# Patient Record
Sex: Female | Born: 1945 | Race: Black or African American | Hispanic: No | Marital: Married | Smoking: Never smoker
Health system: Southern US, Community
[De-identification: ages and names within clinical notes are randomized; demographics above are authoritative.]

## PROBLEM LIST (undated history)

## (undated) DIAGNOSIS — I10 Essential (primary) hypertension: Secondary | ICD-10-CM

## (undated) HISTORY — PX: BRAIN SURGERY: SHX531

## (undated) HISTORY — PX: ABDOMINAL HYSTERECTOMY: SHX81

---

## 1984-12-02 HISTORY — PX: BUNIONECTOMY: SHX129

## 2002-10-01 ENCOUNTER — Ambulatory Visit (HOSPITAL_COMMUNITY): Admission: RE | Admit: 2002-10-01 | Discharge: 2002-10-01 | Payer: Self-pay | Admitting: Internal Medicine

## 2005-06-22 ENCOUNTER — Emergency Department (HOSPITAL_COMMUNITY): Admission: EM | Admit: 2005-06-22 | Discharge: 2005-06-22 | Payer: Self-pay | Admitting: Emergency Medicine

## 2005-11-19 IMAGING — CR DG FOOT COMPLETE 3+V*L*
3 series · 3 of 3 positions shown · non-contrast
Comparison: none

CLINICAL DATA: Medial mid left foot pain following injury.

LEFT FOOT - 3 VIEW

[view not recorded (1 of 3)]
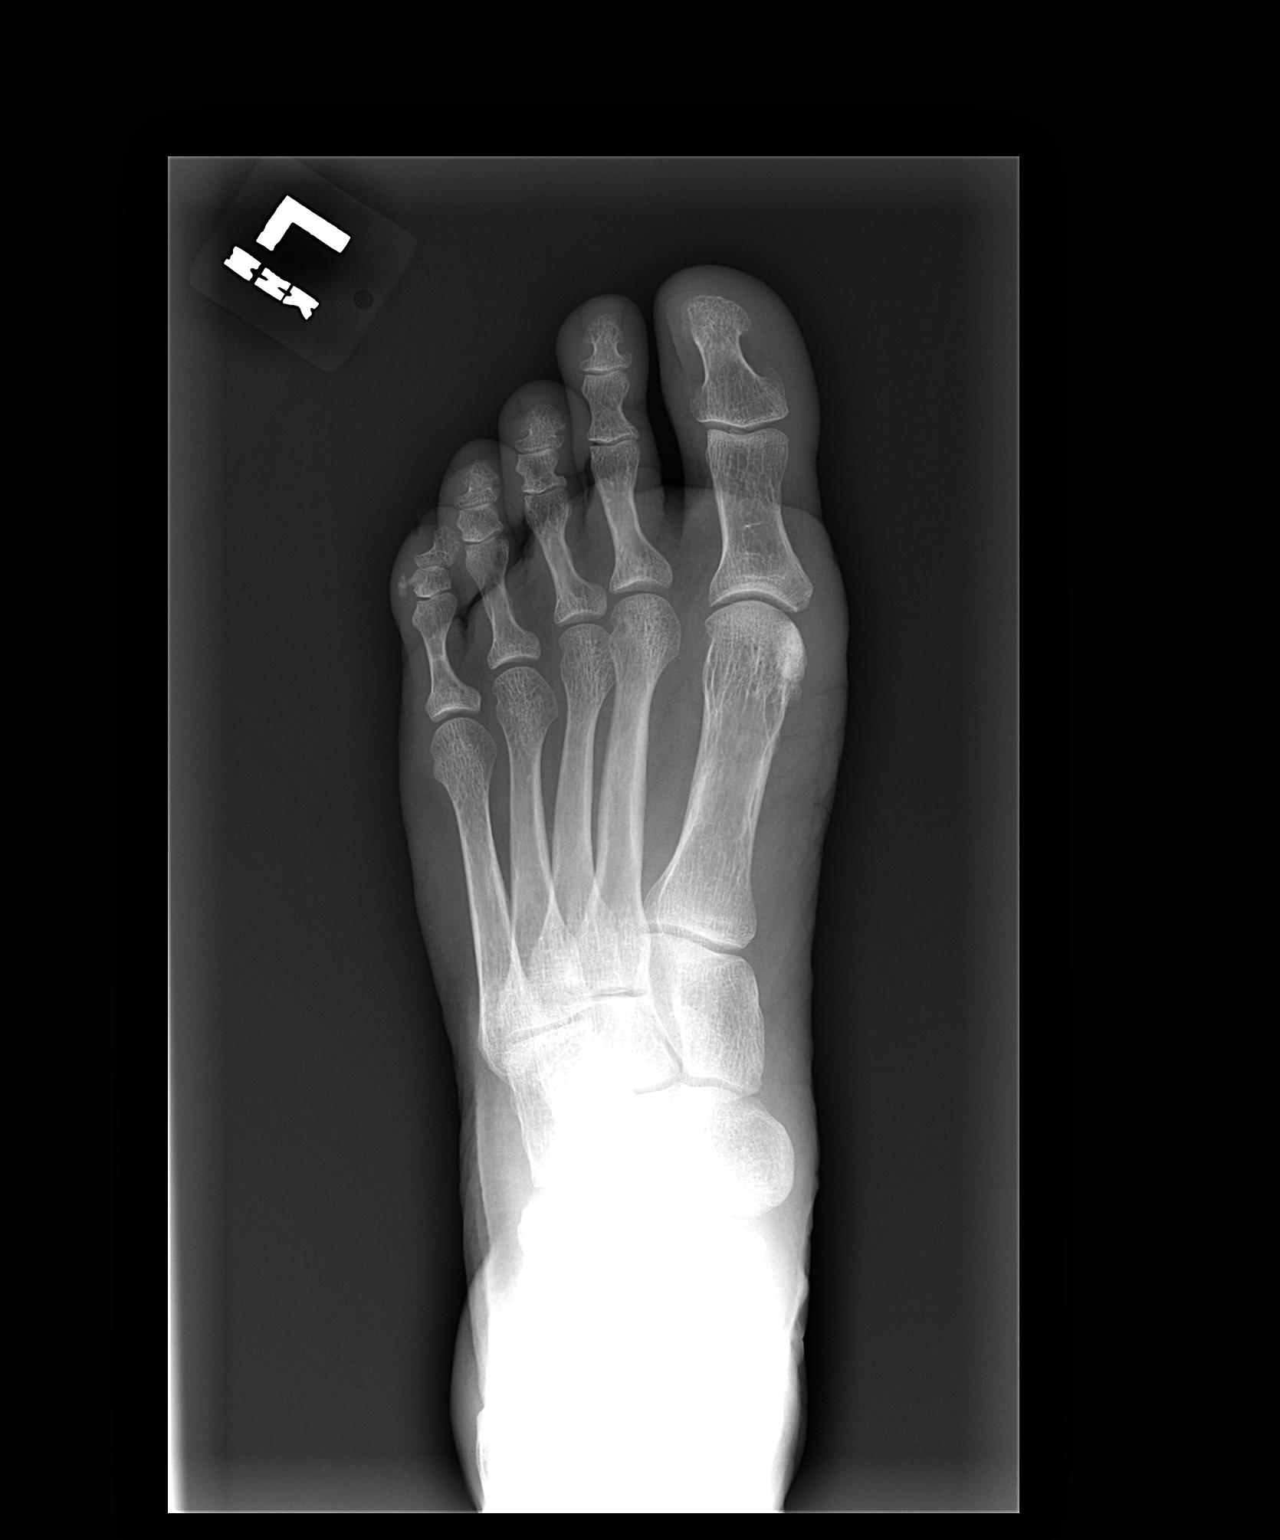

[view not recorded (2 of 3)]
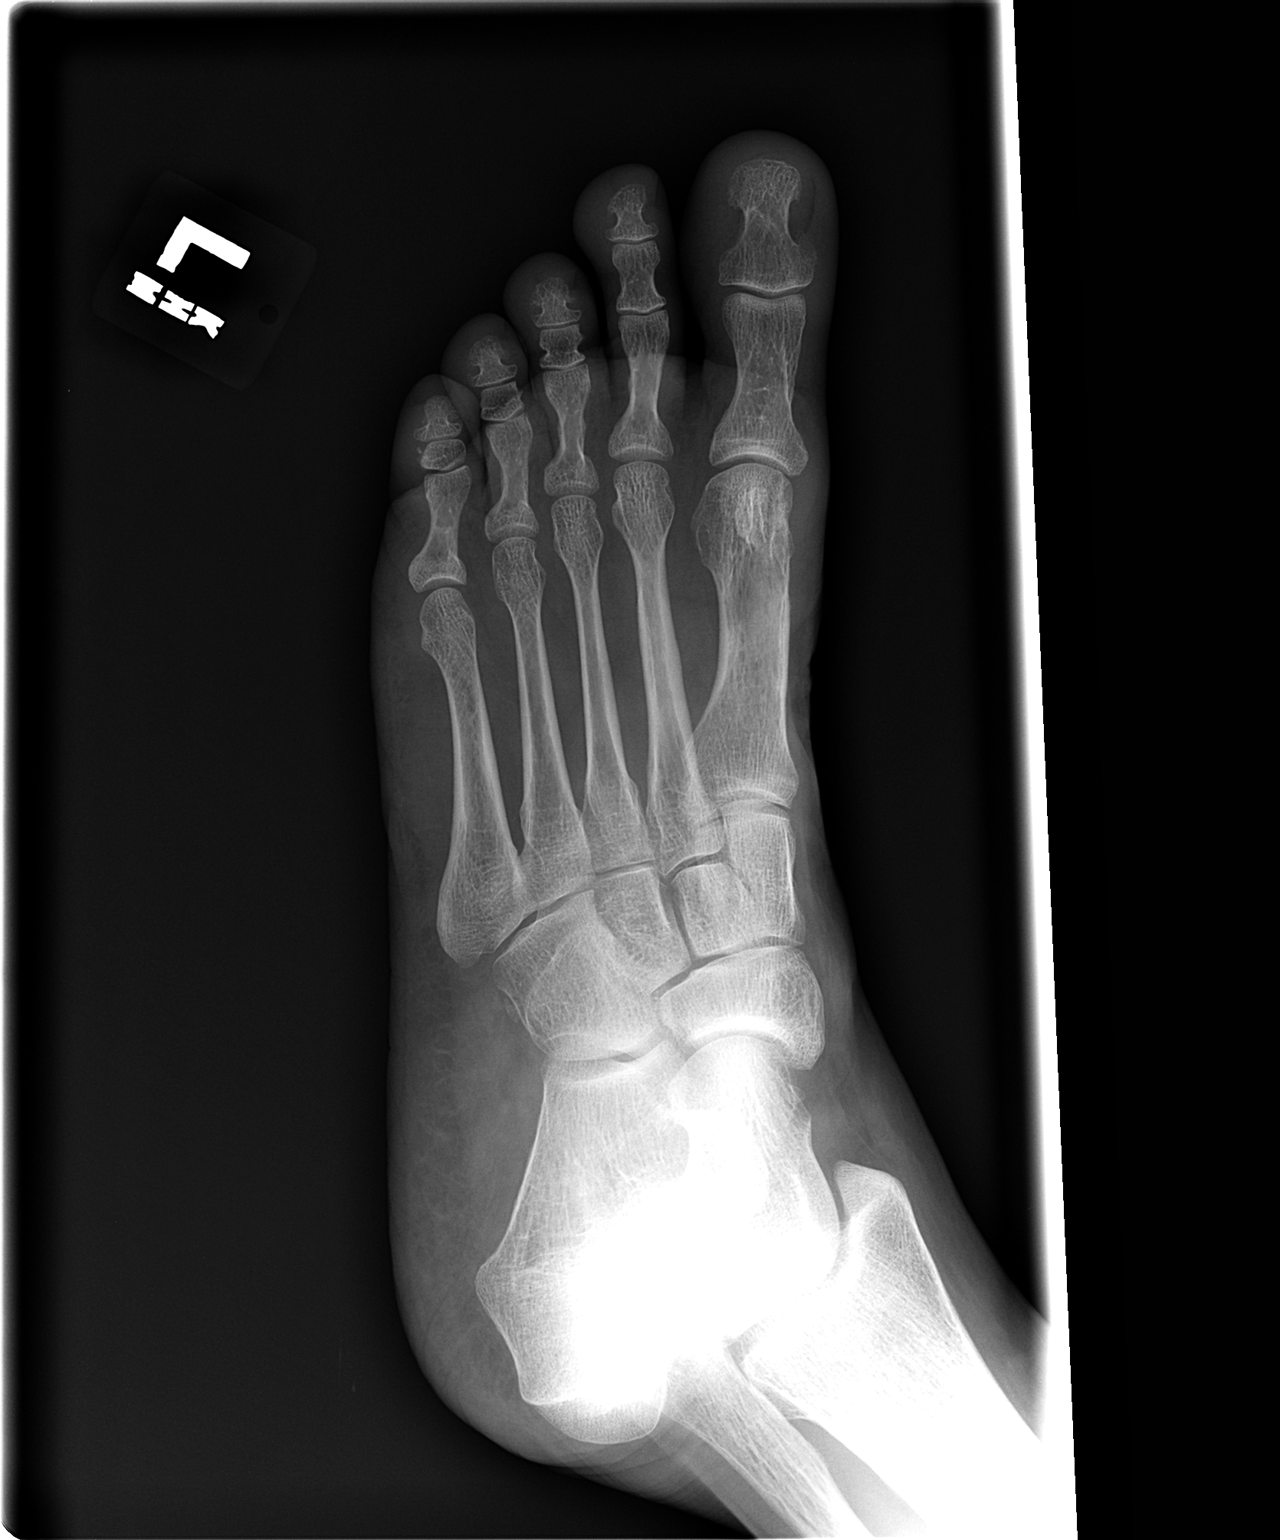

[view not recorded (3 of 3)]
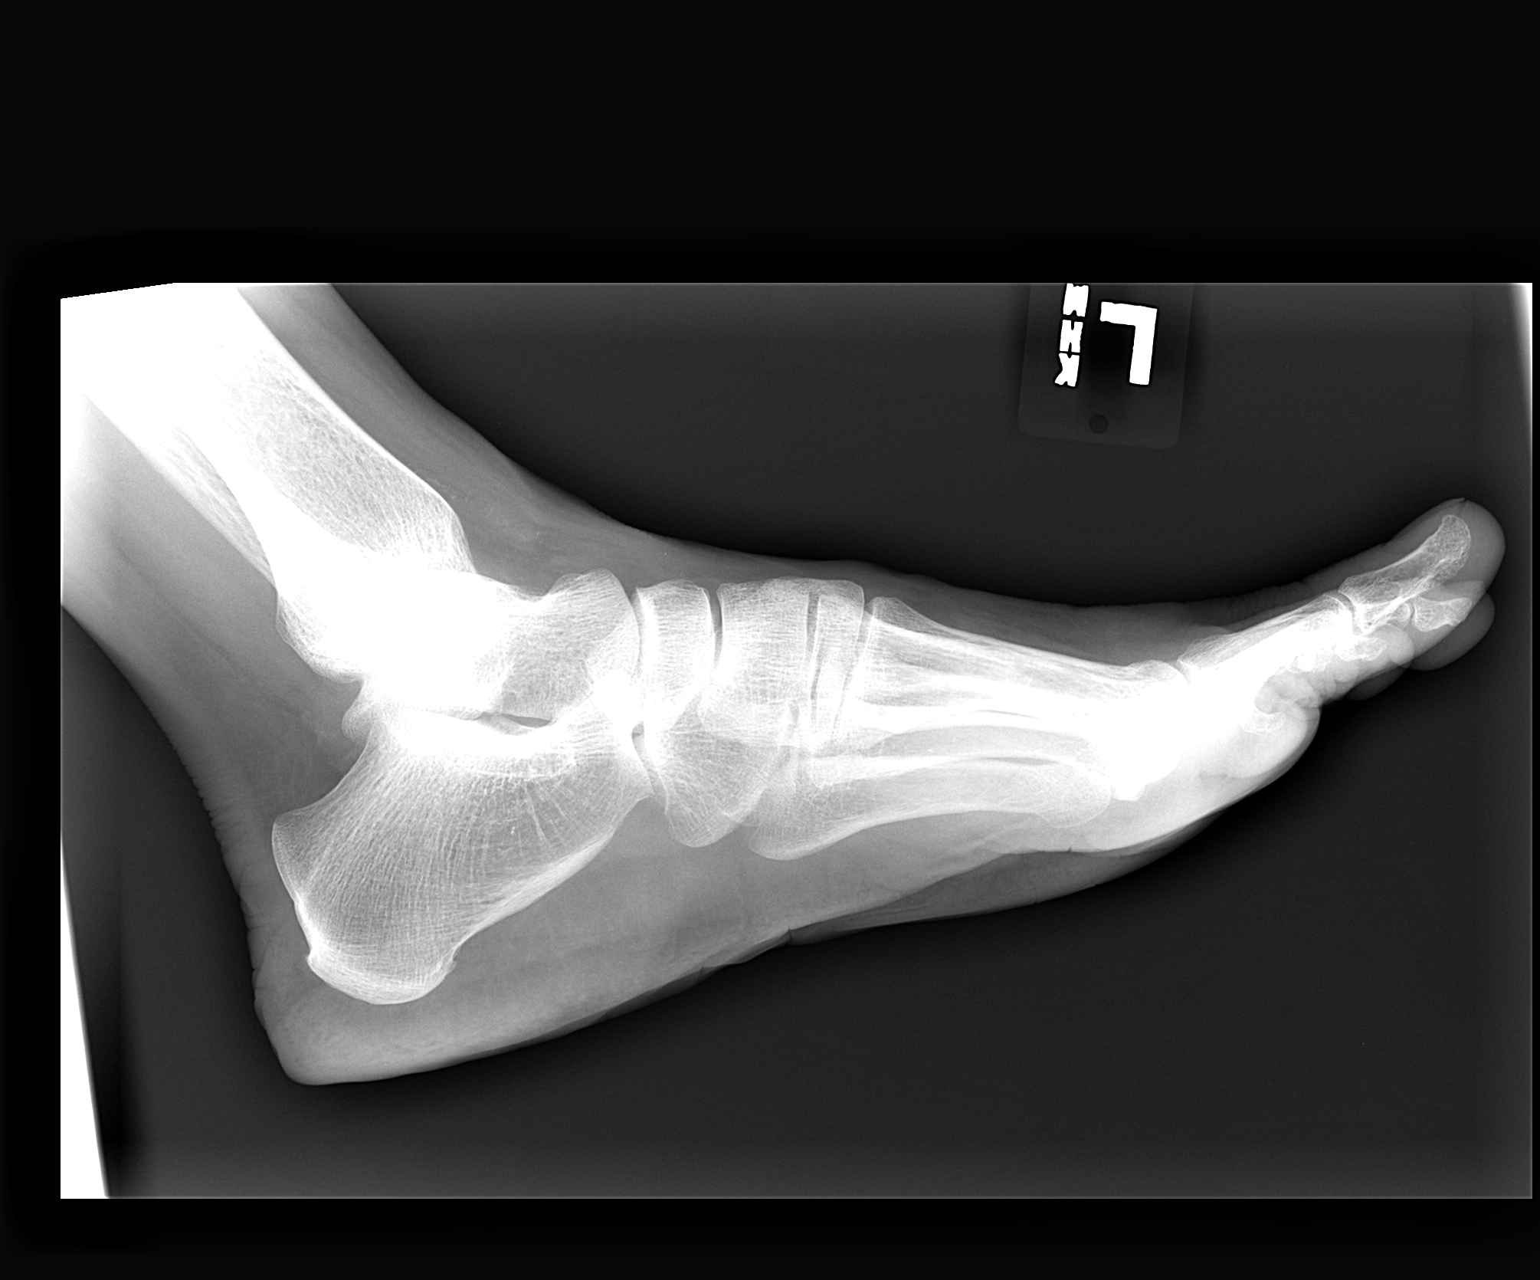

[3 of 3 positions shown; findings below may reference images not displayed]

FINDINGS: Soft tissue calcification adjacent to the lateral aspect of the fifth
middle phalanx. No fracture or dislocation seen.

IMPRESSION

No fracture.

## 2005-12-09 ENCOUNTER — Ambulatory Visit: Payer: Self-pay | Admitting: Internal Medicine

## 2005-12-11 ENCOUNTER — Ambulatory Visit (HOSPITAL_COMMUNITY): Admission: RE | Admit: 2005-12-11 | Discharge: 2005-12-11 | Payer: Self-pay | Admitting: Internal Medicine

## 2006-01-17 ENCOUNTER — Ambulatory Visit: Payer: Self-pay | Admitting: Internal Medicine

## 2006-02-14 ENCOUNTER — Ambulatory Visit (HOSPITAL_COMMUNITY): Admission: RE | Admit: 2006-02-14 | Discharge: 2006-02-14 | Payer: Self-pay | Admitting: Internal Medicine

## 2006-02-14 ENCOUNTER — Ambulatory Visit: Payer: Self-pay | Admitting: Internal Medicine

## 2006-05-10 IMAGING — CT CT PELVIS W/ CM
1 of 2 series · 14 of 32 positions shown, 19 images · IV contrast (omnipaque)
Comparison: None.

CLINICAL DATA: Right lower quadrant pain ? previous hysterectomy and appendectomy.
 ABDOMEN CT WITH CONTRAST:
TECHNIQUE: Multidetector CT imaging of the abdomen was performed following the standard protocol during bolus administration of intravenous contrast.
 Contrast:  100 cc Omnipaque 300 and oral contrast.
TECHNIQUE: Multidetector CT imaging of the pelvis was performed following the standard protocol during bolus administration of intravenous contrast.

[Series 224: — · axial · 0.70mm/px · z∈[+1295,+1675]mm · 14 of 84 slices shown, 19 images]
[im 4/84  soft-tissue]
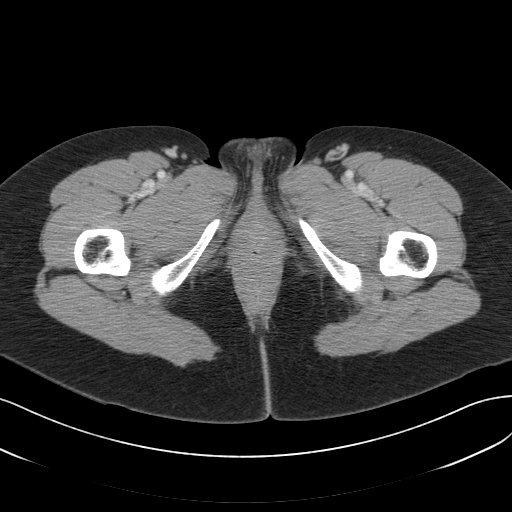
[im 4/84  bone]
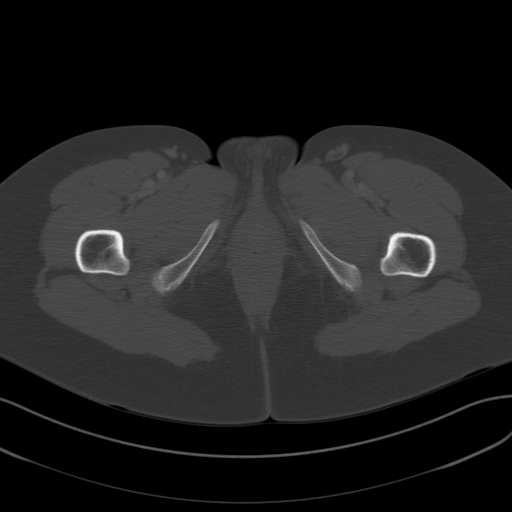
[im 12/84  soft-tissue]
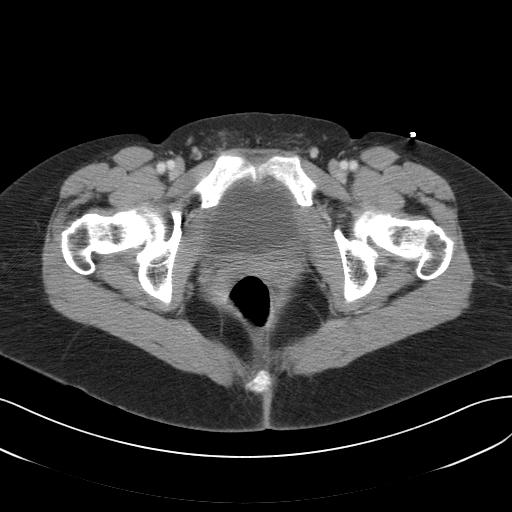
[im 16/84  soft-tissue]
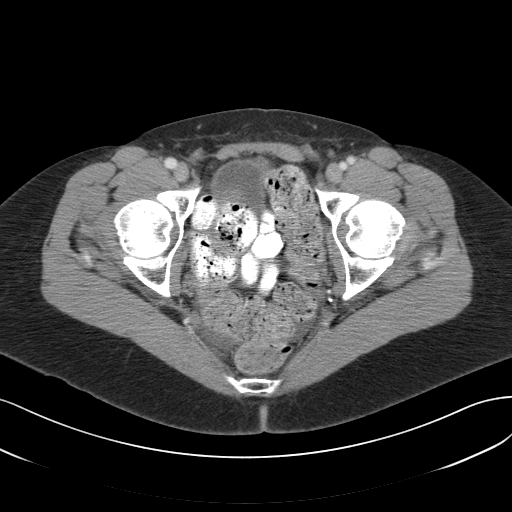
[im 24/84  soft-tissue]
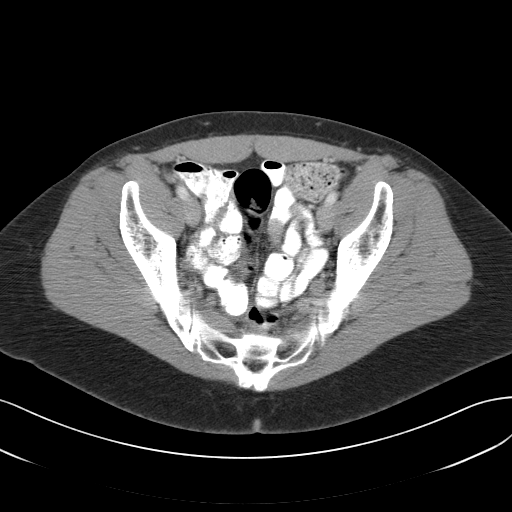
[im 28/84  soft-tissue]
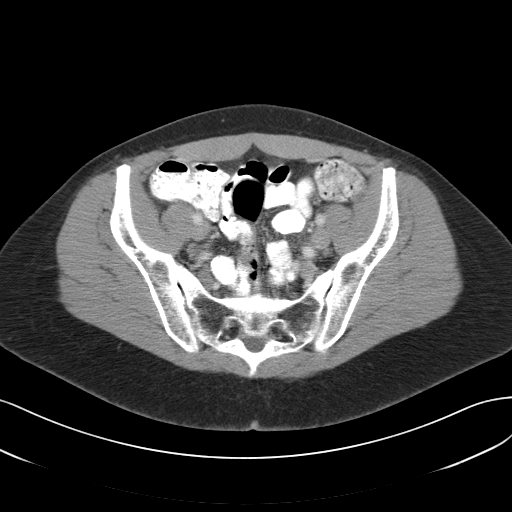
[im 36/84  soft-tissue]
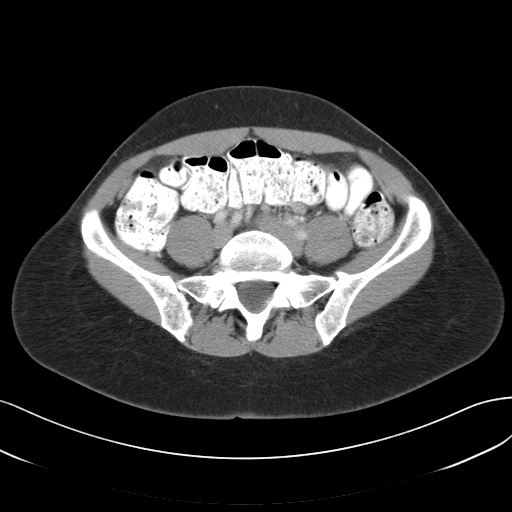
[im 44/84  soft-tissue]
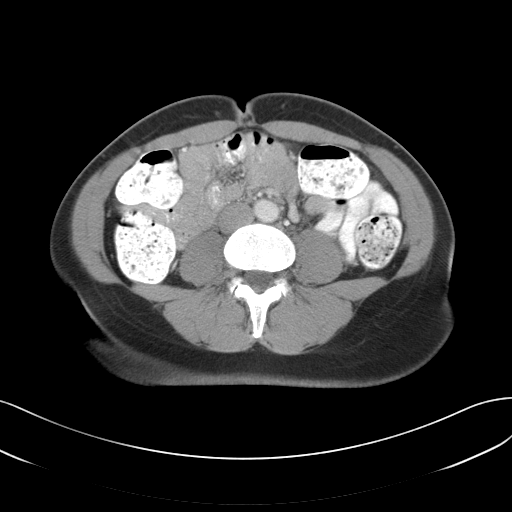
[im 48/84  soft-tissue]
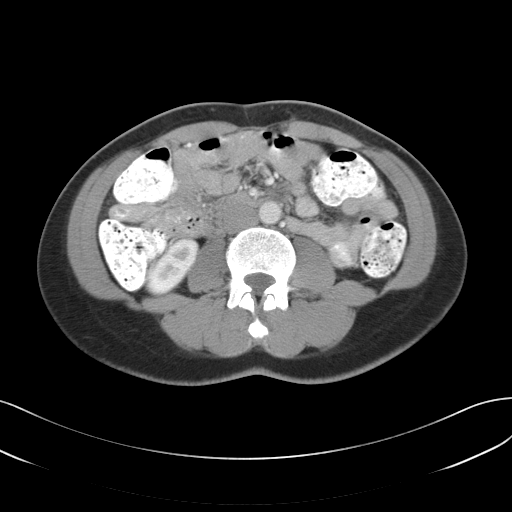
[im 56/84  soft-tissue]
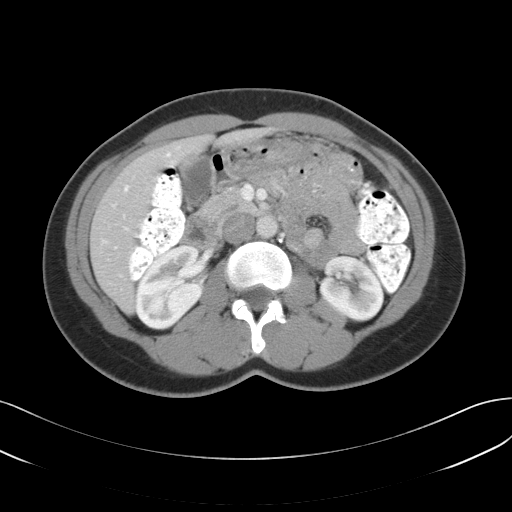
[im 56/84  bone]
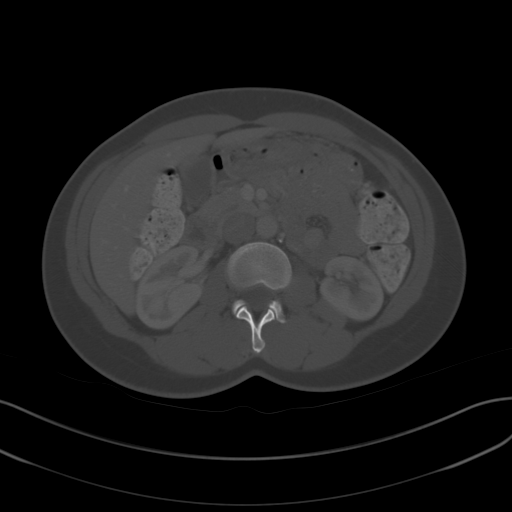
[im 60/84  soft-tissue]
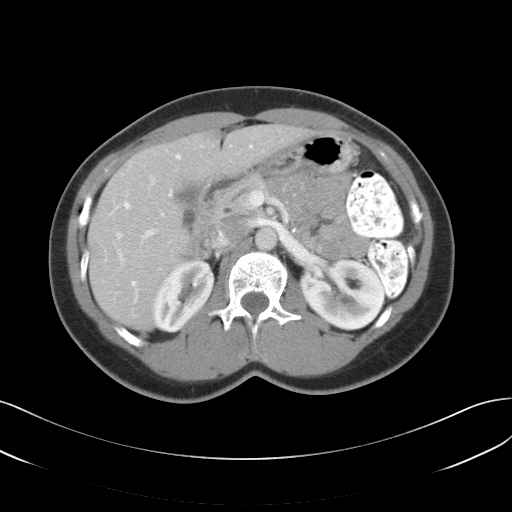
[im 68/84  soft-tissue]
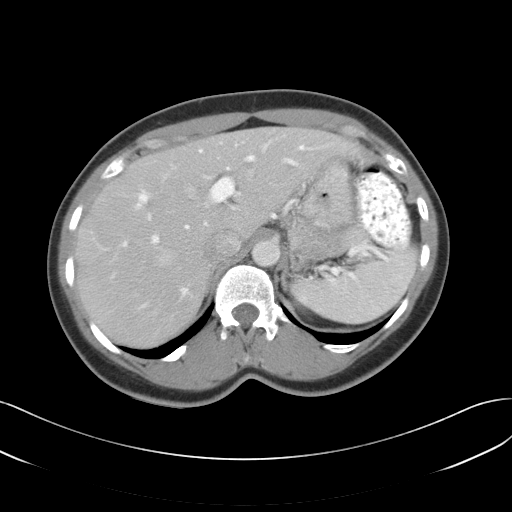
[im 68/84  lung]
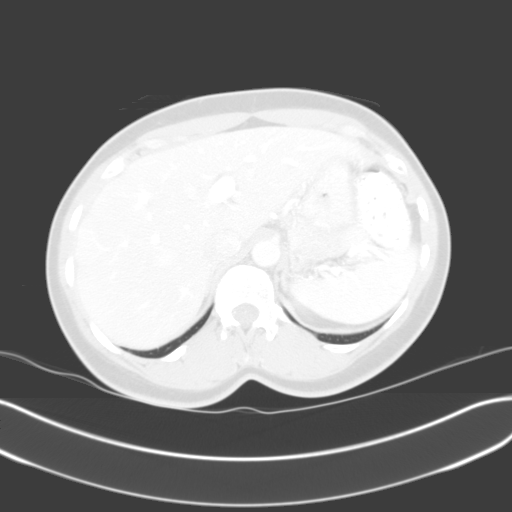
[im 72/84  soft-tissue]
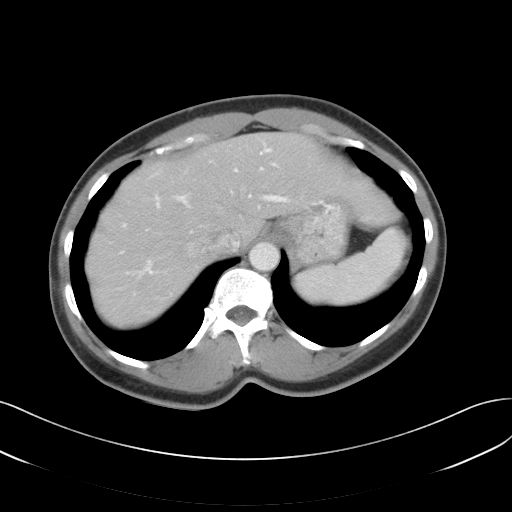
[im 72/84  lung]
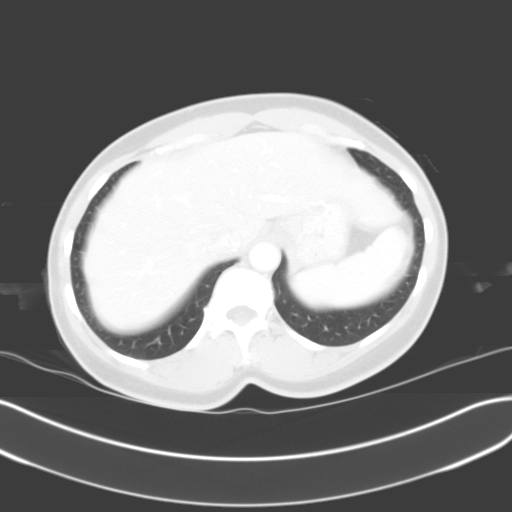
[im 76/84  lung]
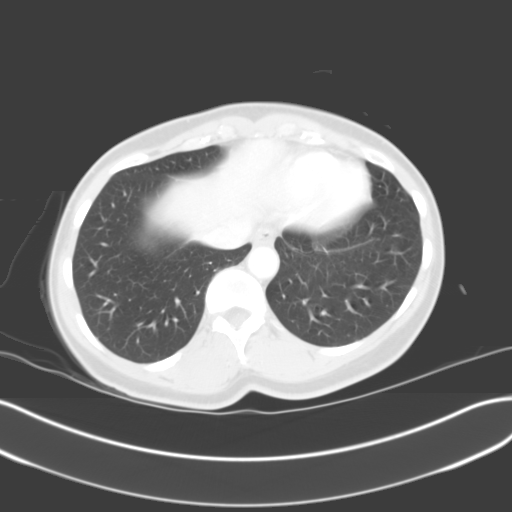
[im 80/84  soft-tissue]
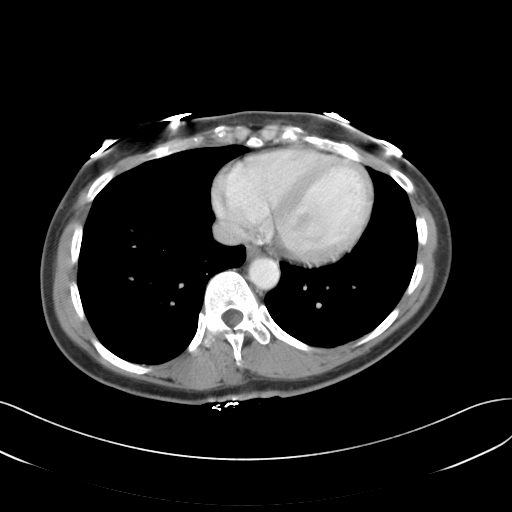
[im 80/84  lung]
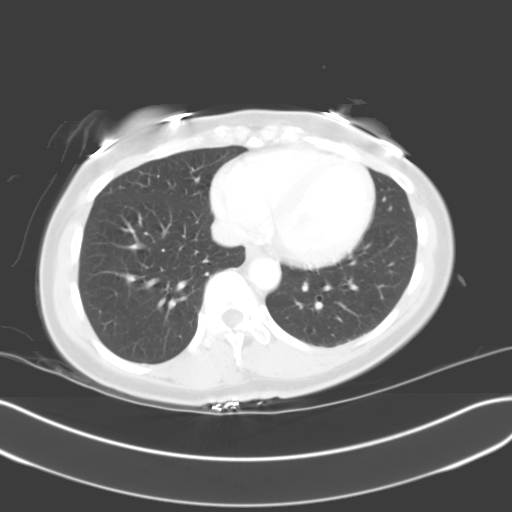

[14 of 32 positions shown; findings below may reference images not displayed]

FINDINGS: Lung bases are clear.  No significant lesions of the liver. There is a tiny lucency in the periphery of the right lobe seen on image #27 that is likely a tiny cyst.  No intra or extrahepatic bile duct dilatation. The spleen, pancreas, and adrenals normal.  Early and delayed images of the kidneys unremarkable. No adenopathy or ascites. No calcified gallstones or bile duct dilatation.
IMPRESSION: No acute or significant findings ? there is probably a tiny right hepatic cyst.
 PELVIS CT WITH CONTRAST:
FINDINGS: No focal masses, adenopathy, or fluid collections. Prior hysterectomy and appendectomy. Pelvic sidewalls and presacral space normal. Relative increased stool throughout the colon.
IMPRESSION: No acute or significant findings ? there is increased stool throughout the colon.

## 2007-03-12 ENCOUNTER — Ambulatory Visit: Payer: Self-pay | Admitting: Internal Medicine

## 2011-04-19 NOTE — Op Note (Signed)
NAME:  Priscilla Brown, Priscilla Brown                        ACCOUNT NO.:  [id number]   MEDICAL RECORD NO.:  [id number]                   PATIENT TYPE:  AMB   LOCATION:  DAY                                  FACILITY:  [facility]   PHYSICIAN:  Lionel December, M.D.                 DATE OF BIRTH:  23-Feb-1946   DATE OF PROCEDURE:  10/01/2002  DATE OF DISCHARGE:                                 OPERATIVE REPORT   PROCEDURE:  Total colonoscopy.   ENDOSCOPIST:  Lionel December, M.D.   INDICATIONS:  This patient is a 65 year old African-American female was  undergoing screening colonoscopy.  The procedure was reviewed with the  patient and informed consent was obtained.   PREOPERATIVE MEDICATIONS:  Demerol 50 mg IV and Versed 5 mg IV in divided  doses.   INSTRUMENT:  Olympus video system.   FINDINGS:  Procedure performed in endoscopy suite.  The patient's vital  signs and O2 saturation were monitored during the procedure and remained  stable.  The patient was placed in the left lateral recumbent position and  rectal examination was performed.  No abnormality noted on external or  digital exam.   The scope was placed in the rectum and advanced under vision into the  sigmoid colon and beyond.  Preparation was excellent.  Scope was passed to  the cecum which was identified by appendiceal stump and the ileocecal valve.  Pictures were taken for the record.  A few small erosions overlying the  ileocecal valve and another one crossing the valve.  Pictures taken for the  record.   I attempted to pass the scope into the TR but could not do so because I  could never see the opening.  Biopsy was taken from the ileocecal valve and  from the cecal erosion.  As the scope was withdrawn the colonic mucosa was  once again carefully examined.  There was a single small AVM at the hepatic  flexure which was not bleeding and was left alone.  Because the rest of the  colon was normal, the rectal mucosa similarly was  normal.   The scope was retroflexed to examine the anorectal junction and hemorrhoids  were noted below the dentate line.  The endoscope was straightened and  withdrawn.  The patient tolerated the procedure well.   FINAL DIAGNOSES:  1. Examination performed to the cecum.  2. A few erosions at the cecum, possibly an insignificant finding.  Biopsy     taken to make sure that this is not granulomatous disease.  3. 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  4. No evidence of colonic neoplasm.  5. Small external hemorrhoids.    RECOMMENDATIONS:  1. Standard instructions given.  2. I will be contacting the patient with biopsy results and further     recommendations.  Lionel December, M.D.    NR/MEDQ  D:  10/01/2002  T:  10/01/2002  Job:  [id number]   cc:   Weyman Pedro, M.D.  [place]  [place]

## 2011-04-19 NOTE — Op Note (Signed)
NAMEBRADEN, Priscilla Brown              ACCOUNT NO.:  [id number]   MEDICAL RECORD NO.:  [id number]          PATIENT TYPE:  AMB   LOCATION:  DAY                           FACILITY:  [facility]   PHYSICIAN:  Lionel December, M.D.    DATE OF BIRTH:  1946/03/05   DATE OF PROCEDURE:  02/14/2006  DATE OF DISCHARGE:                                 OPERATIVE REPORT   PROCEDURE:  Colonoscopy.   INDICATION:  [name] is a 65 year old African-American female who was  recently evaluated for right lower quadrant abdominal pain, possibly due to  constipation, predominant IBS, when she was noted to have heme-positive  stool. We recommended colonoscopy given that she had focal colitis at the  cecum on the last exam of October 2003. Procedure and risks were reviewed  with the patient and informed consent was obtained. Family history is  negative for colorectal carcinoma with positive further malignancies.   MEDICINES FOR CONSCIOUS SEDATION:  Demerol 50 mg IV Versed 7 mg IV in  divided dose.   FINDINGS:  Procedure performed in endoscopy suite. The patient's vital signs  and O2 saturations were monitored during procedure and remained stable. The  patient was placed in the left lateral position and rectal examination  performed. No abnormality noted on external or digital exam. Olympus  videoscope was placed in the rectum and advanced under vision into sigmoid  colon and beyond. Preparation was excellent. Redundant colon, particularly  hepatic flexure, multiple loops. Using abdominal pressure and different  positions, I was able to finally pass the scope into cecum which was  identified by ileocecal valve. Blind cecum was normal.   This area was carefully examined and no erosions or ulcers noted as on  previous exam. Pictures taken for the record. No erosions or ulcers are  noted. As the scope was withdrawn colonic mucosa was once again carefully  examined. A single AVM at hepatic flexure, which was not bleeding,  was left  alone. Mucosa and rest of the colon was normal. Rectal mucosa similarly was  normal. Scope was retroflexed to examine anorectal junction and small  hemorrhoids were noted below the dentate line. Endoscope was straightened  and withdrawn. The patient tolerated the procedure well.   FINAL DIAGNOSIS:  1.  Redundant colon with a single arteriovenous malformation at hepatic      flexure.  2.  External hemorrhoids.  3.  No evidence of colitis or neoplasm.   RECOMMENDATIONS:  1.  She will resume her usual meds.  2.  Continue high-fiber diet, fiber supplement and Peri-Colace as before.  3.  She will return for OV in 1 year from now.      Lionel December, M.D.  Electronically Signed     NR/MEDQ  D:  02/14/2006  T:  02/14/2006  Job:  [id number]

## 2016-03-14 ENCOUNTER — Encounter (INDEPENDENT_AMBULATORY_CARE_PROVIDER_SITE_OTHER): Payer: Self-pay | Admitting: *Deleted

## 2016-03-27 ENCOUNTER — Other Ambulatory Visit (INDEPENDENT_AMBULATORY_CARE_PROVIDER_SITE_OTHER): Payer: Self-pay | Admitting: *Deleted

## 2016-03-27 DIAGNOSIS — Z1211 Encounter for screening for malignant neoplasm of colon: Secondary | ICD-10-CM

## 2016-06-03 ENCOUNTER — Telehealth (INDEPENDENT_AMBULATORY_CARE_PROVIDER_SITE_OTHER): Payer: Self-pay | Admitting: *Deleted

## 2016-06-03 ENCOUNTER — Other Ambulatory Visit (INDEPENDENT_AMBULATORY_CARE_PROVIDER_SITE_OTHER): Payer: Self-pay | Admitting: *Deleted

## 2016-06-03 ENCOUNTER — Encounter (INDEPENDENT_AMBULATORY_CARE_PROVIDER_SITE_OTHER): Payer: Self-pay | Admitting: *Deleted

## 2016-06-03 NOTE — Telephone Encounter (Signed)
Referring MD/PCP: daniel   Procedure: tcs  Reason/Indication:  screening  Has patient had this procedure before?  Yes, 10 yrs ago  If so, when, by whom and where?    Is there a family history of colon cancer?  no  Who?  What age when diagnosed?    Is patient diabetic?   no      Does patient have prosthetic heart valve or mechanical valve?  no  Do you have a pacemaker?  no  Has patient ever had endocarditis? no  Has patient had joint replacement within last 12 months?  no  Does patient tend to be constipated or take laxatives? some  Does patient have a history of alcohol/drug use?  no  Is patient on Coumadin, Plavix and/or Aspirin? no  Medications: diltiazem 180 mg daily, fish oil, glucosamine  Allergies: sulfur drugs, pcn, codeine  Medication Adjustment:   Procedure date & time: 07/04/16 at 730

## 2016-06-03 NOTE — Telephone Encounter (Signed)
Patient needs trilyte 

## 2016-06-05 NOTE — Telephone Encounter (Signed)
agree

## 2016-06-06 MED ORDER — PEG 3350-KCL-NA BICARB-NACL 420 G PO SOLR: 4000.0000 mL | Freq: Once | ORAL | Status: DC

## 2016-07-04 ENCOUNTER — Encounter (HOSPITAL_COMMUNITY)
Admission: RE | Disposition: A | Discharge status: Home / Self Care | Payer: Self-pay | Source: Ambulatory Visit | Attending: Internal Medicine

## 2016-07-04 ENCOUNTER — Encounter (HOSPITAL_COMMUNITY): Payer: Self-pay

## 2016-07-04 ENCOUNTER — Ambulatory Visit (HOSPITAL_COMMUNITY)
Admission: RE | Admit: 2016-07-04 | Discharge: 2016-07-04 | Disposition: A | Discharge status: Home / Self Care | Payer: Managed Care, Other (non HMO) | Source: Ambulatory Visit | Attending: Internal Medicine | Admitting: Internal Medicine

## 2016-07-04 DIAGNOSIS — I10 Essential (primary) hypertension: Secondary | ICD-10-CM | POA: Diagnosis not present

## 2016-07-04 DIAGNOSIS — Z880 Allergy status to penicillin: Secondary | ICD-10-CM | POA: Diagnosis not present

## 2016-07-04 DIAGNOSIS — K644 Residual hemorrhoidal skin tags: Secondary | ICD-10-CM

## 2016-07-04 DIAGNOSIS — K6389 Other specified diseases of intestine: Secondary | ICD-10-CM | POA: Diagnosis not present

## 2016-07-04 DIAGNOSIS — Z885 Allergy status to narcotic agent status: Secondary | ICD-10-CM | POA: Diagnosis not present

## 2016-07-04 DIAGNOSIS — K529 Noninfective gastroenteritis and colitis, unspecified: Secondary | ICD-10-CM | POA: Insufficient documentation

## 2016-07-04 DIAGNOSIS — K633 Ulcer of intestine: Secondary | ICD-10-CM | POA: Diagnosis not present

## 2016-07-04 DIAGNOSIS — Z882 Allergy status to sulfonamides status: Secondary | ICD-10-CM | POA: Diagnosis not present

## 2016-07-04 DIAGNOSIS — Z1211 Encounter for screening for malignant neoplasm of colon: Secondary | ICD-10-CM | POA: Diagnosis not present

## 2016-07-04 DIAGNOSIS — Z79899 Other long term (current) drug therapy: Secondary | ICD-10-CM | POA: Insufficient documentation

## 2016-07-04 HISTORY — DX: Essential (primary) hypertension: I10

## 2016-07-04 HISTORY — PX: COLONOSCOPY: SHX5424

## 2016-07-04 SURGERY — COLONOSCOPY
Anesthesia: Moderate Sedation

## 2016-07-04 MED ORDER — MIDAZOLAM HCL 5 MG/5ML IJ SOLN
INTRAMUSCULAR | Status: DC | PRN
  Administered 2016-07-04 (×2): 2 mg via INTRAVENOUS
  Administered 2016-07-04: 1 mg via INTRAVENOUS

## 2016-07-04 MED ORDER — MEPERIDINE HCL 50 MG/ML IJ SOLN
INTRAMUSCULAR | Status: AC
  Filled 2016-07-04: qty 1

## 2016-07-04 MED ORDER — MIDAZOLAM HCL 5 MG/5ML IJ SOLN
INTRAMUSCULAR | Status: AC
  Filled 2016-07-04: qty 10

## 2016-07-04 MED ORDER — STERILE WATER FOR IRRIGATION IR SOLN
Status: DC | PRN
  Administered 2016-07-04: 08:00:00

## 2016-07-04 MED ORDER — MEPERIDINE HCL 50 MG/ML IJ SOLN
INTRAMUSCULAR | Status: DC | PRN
  Administered 2016-07-04 (×2): 25 mg via INTRAVENOUS

## 2016-07-04 MED ORDER — SODIUM CHLORIDE 0.9 % IV SOLN
INTRAVENOUS | Status: DC
Start: 2016-07-04 — End: 2016-07-04
  Administered 2016-07-04: 07:00:00 via INTRAVENOUS

## 2016-07-04 NOTE — H&P (Signed)
Priscilla Brown is an 70 y.o. female.   Chief Complaint: Patient is here for colonoscopy. HPI: 70 year old African-American female who is here for screening colonoscopy. She denies abdominal pain or rectal bleeding has had constipation lately. She has decreased intake of fiber foods. Last colonoscopy was in October 2003. It was negative for colonic polyps. Family history is negative for CRC.  Past Medical History:  Diagnosis Date  . Hypertension     Past Surgical History:  Procedure Laterality Date  . ABDOMINAL HYSTERECTOMY    . BRAIN SURGERY  1996 St[place]  . BUNIONECTOMY Bilateral 1986    History reviewed. No pertinent family history. Social History:  reports that she has never smoked. She has never used smokeless tobacco. She reports that she does not drink alcohol or use drugs.  Allergies:  Allergies  Allergen Reactions  . Codeine     Jittery   . Erythromycin     Unknown   . Penicillins     unknown  . Sulfa Antibiotics Rash    Rapid heartbeat     Medications Prior to Admission  Medication Sig Dispense Refill  . diltiazem (DILACOR XR) 180 MG 24 hr capsule Take 180 mg by mouth daily.    Marland Kitchen loratadine (CLARITIN) 10 MG tablet Take 10 mg by mouth daily as needed for allergies.    . Multiple Vitamins-Minerals (MULTIVITAMINS THER. W/MINERALS) TABS tablet Take 1 tablet by mouth daily.    Ailene Ards 3-6-9 Fatty Acids (OMEGA-3-6-9 PO) Take 1 capsule by mouth daily.    . polyethylene glycol-electrolytes (TRILYTE) 420 g solution Take 4,000 mLs by mouth once. 4000 mL 0    No results found for this or any previous visit (from the past 48 hour(s)). No results found.  ROS  Blood pressure (!) 152/68, pulse 65, temperature 98.2 F (36.8 C), temperature source Oral, resp. rate 14, height 5\' 4"  (1.626 m), weight 144 lb (65.3 kg), SpO2 100 %. Physical Exam  Constitutional: She appears well-developed and well-nourished.  HENT:  Mouth/Throat: Oropharynx is clear and moist.   Eyes: Conjunctivae are normal. No scleral icterus.  Neck: No thyromegaly present.  Cardiovascular: Normal rate, regular rhythm and normal heart sounds.   No murmur heard. Respiratory: Effort normal and breath sounds normal.  GI: Soft. She exhibits no distension and no mass. There is no tenderness.  Musculoskeletal: She exhibits no edema.  Lymphadenopathy:    She has no cervical adenopathy.  Neurological: She is alert.  Skin: Skin is warm and dry.     Assessment/Plan Average risk screening colonoscopy.  Lionel December, MD 07/04/2016, 7:34 AM

## 2016-07-04 NOTE — Op Note (Signed)
[facility] Patient Name: Priscilla Brown Procedure Date: 07/04/2016 7:32 AM MRN: [id number] Date of Birth: August 09, 1946 Attending MD: Lionel December , MD CSN: [id number] Age: 70 Admit Type: Outpatient Procedure:                Colonoscopy Indications:              Screening for colorectal malignant neoplasm Providers:                Lionel December, MD, Edrick Kins, RN, Lollie Marrow.                            Val Eagle, Technician Referring MD:             Donzetta Sprung, MD Medicines:                Meperidine 50 mg IV, Midazolam 5 mg IV Complications:            No immediate complications. Estimated Blood Loss:     Estimated blood loss was minimal. Procedure:                Pre-Anesthesia Assessment:                           - Prior to the procedure, a History and Physical                            was performed, and patient medications and                            allergies were reviewed. The patient's tolerance of                            previous anesthesia was also reviewed. The risks                            and benefits of the procedure and the sedation                            options and risks were discussed with the patient.                            All questions were answered, and informed consent                            was obtained. Prior Anticoagulants: The patient has                            taken no previous anticoagulant or antiplatelet                            agents. ASA Grade Assessment: I - A normal, healthy                            patient. After reviewing the risks and benefits,  the patient was deemed in satisfactory condition to                            undergo the procedure.                           After obtaining informed consent, the colonoscope                            was passed under direct vision. Throughout the                            procedure, the patient's blood pressure, pulse, and                             oxygen saturations were monitored continuously. The                            EC-[id number] ([id number]) scope was introduced through                            the anus and advanced to the the terminal ileum,                            with identification of the appendiceal orifice and                            IC valve. The colonoscopy was performed without                            difficulty. The patient tolerated the procedure                            well. The quality of the bowel preparation was                            excellent. The terminal ileum, ileocecal valve,                            appendiceal orifice, and rectum were photographed. Scope In: 7:43:23 AM Scope Out: 8:05:09 AM Scope Withdrawal Time: 0 hours 10 minutes 19 seconds  Total Procedure Duration: 0 hours 21 minutes 46 seconds  Findings:      The terminal ileum appeared normal.      A few non-bleeding erosions were found in the cecum and at the ileocecal       valve. No stigmata of recent bleeding were seen. Biopsies were taken       with a cold forceps for histology.      The exam was otherwise without abnormality.      External hemorrhoids were found during retroflexion. The hemorrhoids       were small. Impression:               - The examined portion of the ileum was normal.                           -  A few erosions in the cecum and at the ileocecal                            valve. Biopsied.                           - The examination was otherwise normal.                           - External hemorrhoids.                           comment: patient had cecal erosions on her last                            exam of Oct, 2003. Doubt IBD> Moderate Sedation:      Moderate (conscious) sedation was administered by the endoscopy nurse       and supervised by the endoscopist. The following parameters were       monitored: oxygen saturation, heart rate, blood pressure, CO2       capnography and response  to care. Total physician intraservice time was       28 minutes. Recommendation:           - Patient has a contact number available for                            emergencies. The signs and symptoms of potential                            delayed complications were discussed with the                            patient. Return to normal activities tomorrow.                            Written discharge instructions were provided to the                            patient.                           - High fiber diet today.                           - Continue present medications.                           - Repeat colonoscopy in 10 years for screening                            purposes.                           - Await pathology results. Procedure Code(s):        --- Professional ---  46962, Colonoscopy, flexible; with biopsy, single                            or multiple                           99152, Moderate sedation services provided by the                            same physician or other qualified health care                            professional performing the diagnostic or                            therapeutic service that the sedation supports,                            requiring the presence of an independent trained                            observer to assist in the monitoring of the                            patient's level of consciousness and physiological                            status; initial 15 minutes of intraservice time,                            patient age 5 years or older                           [phone], Moderate sedation services; each additional                            15 minutes intraservice time Diagnosis Code(s):        --- Professional ---                           Z12.11, Encounter for screening for malignant                            neoplasm of colon                           K64.4, Residual hemorrhoidal skin  tags                           K63.3, Ulcer of intestine CPT copyright 2016 [employer]. All rights reserved. The codes documented in this report are preliminary and upon coder review may  be revised to meet current compliance requirements. Lionel December, MD Lionel December, MD 07/04/2016 8:28:50 AM This report has been signed electronically. Number of Addenda: 0

## 2016-07-04 NOTE — Discharge Instructions (Signed)
Resume usual medications and high fiber diet. °No driving for 24 hours. °Physician will call with biopsy results. ° ° °Colonoscopy, Care After °These instructions give you information on caring for yourself after your procedure. Your doctor may also give you more specific instructions. Call your doctor if you have any problems or questions after your procedure. °HOME CARE °· Do not drive for 24 hours. °· Do not sign important papers or use machinery for 24 hours. °· You may shower. °· You may go back to your usual activities, but go slower for the first 24 hours. °· Take rest breaks often during the first 24 hours. °· Walk around or use warm packs on your belly (abdomen) if you have belly cramping or gas. °· Drink enough fluids to keep your pee (urine) clear or pale yellow. °· Resume your normal diet. Avoid heavy or fried foods. °· Avoid drinking alcohol for 24 hours or as told by your doctor. °· Only take medicines as told by your doctor. °If a tissue sample (biopsy) was taken during the procedure:  °· Do not take aspirin or blood thinners for 7 days, or as told by your doctor. °· Do not drink alcohol for 7 days, or as told by your doctor. °· Eat soft foods for the first 24 hours. °GET HELP IF: °You still have a small amount of blood in your poop (stool) 2-3 days after the procedure. °GET HELP RIGHT AWAY IF: °· You have more than a small amount of blood in your poop. °· You see clumps of tissue (blood clots) in your poop. °· Your belly is puffy (swollen). °· You feel sick to your stomach (nauseous) or throw up (vomit). °· You have a fever. °· You have belly pain that gets worse and medicine does not help. °MAKE SURE YOU: °· Understand these instructions. °· Will watch your condition. °· Will get help right away if you are not doing well or get worse. °  °This information is not intended to replace advice given to you by your health care provider. Make sure you discuss any questions you have with your health care  provider. °  °Document Released: 12/21/2010 Document Revised: 11/23/2013 Document Reviewed: 07/26/2013 °Elsevier Interactive Patient Education ©2016 [employer] ° ° °High-Fiber Diet °Fiber, also called dietary fiber, is a type of carbohydrate found in fruits, vegetables, whole grains, and beans. A high-fiber diet can have many health benefits. Your health care provider may recommend a high-fiber diet to help: °· Prevent constipation. Fiber can make your bowel movements more regular. °· Lower your cholesterol. °· Relieve hemorrhoids, uncomplicated diverticulosis, or irritable bowel syndrome. °· Prevent overeating as part of a weight-loss plan. °· Prevent heart disease, type 2 diabetes, and certain cancers. °WHAT IS MY PLAN? °The recommended daily intake of fiber includes: °· 38 grams for men under age 50. °· 30 grams for men over age 50. °· 25 grams for women under age 50. °· 21 grams for women over age 50. °You can get the recommended daily intake of dietary fiber by eating a variety of fruits, vegetables, grains, and beans. Your health care provider may also recommend a fiber supplement if it is not possible to get enough fiber through your diet. °WHAT DO I NEED TO KNOW ABOUT A HIGH-FIBER DIET? °· Fiber supplements have not been widely studied for their effectiveness, so it is better to get fiber through food sources. °· Always check the fiber content on the nutrition facts label of any prepackaged food. Look   for foods that contain at least 5 grams of fiber per serving. °· Ask your dietitian if you have questions about specific foods that are related to your condition, especially if those foods are not listed in the following section. °· Increase your daily fiber consumption gradually. Increasing your intake of dietary fiber too quickly may cause bloating, cramping, or gas. °· Drink plenty of water. Water helps you to digest fiber. °WHAT FOODS CAN I EAT? °Grains °Whole-grain breads. Multigrain cereal. Oats and  oatmeal. Brown rice. Barley. Bulgur wheat. Millet. Bran muffins. Popcorn. Rye wafer crackers. °Vegetables °Sweet potatoes. Spinach. Kale. Artichokes. Cabbage. Broccoli. Green peas. Carrots. Squash. °Fruits °Berries. Pears. Apples. Oranges. Avocados. Prunes and raisins. Dried figs. °Meats and Other Protein Sources °Navy, kidney, pinto, and soy beans. Split peas. Lentils. Nuts and seeds. °Dairy °Fiber-fortified yogurt. °Beverages °Fiber-fortified soy milk. Fiber-fortified orange juice. °Other °Fiber bars. °The items listed above may not be a complete list of recommended foods or beverages. Contact your dietitian for more options. °WHAT FOODS ARE NOT RECOMMENDED? °Grains °White bread. Pasta made with refined flour. White rice. °Vegetables °Fried potatoes. Canned vegetables. Well-cooked vegetables.  °Fruits °Fruit juice. Cooked, strained fruit. °Meats and Other Protein Sources °Fatty cuts of meat. Fried poultry or fried fish. °Dairy °Milk. Yogurt. Cream cheese. Sour cream. °Beverages °Soft drinks. °Other °Cakes and pastries. Butter and oils. °The items listed above may not be a complete list of foods and beverages to avoid. Contact your dietitian for more information. °WHAT ARE SOME TIPS FOR INCLUDING HIGH-FIBER FOODS IN MY DIET? °· Eat a wide variety of high-fiber foods. °· Make sure that half of all grains consumed each day are whole grains. °· Replace breads and cereals made from refined flour or white flour with whole-grain breads and cereals. °· Replace white rice with brown rice, bulgur wheat, or millet. °· Start the day with a breakfast that is high in fiber, such as a cereal that contains at least 5 grams of fiber per serving. °· Use beans in place of meat in soups, salads, or pasta. °· Eat high-fiber snacks, such as berries, raw vegetables, nuts, or popcorn. °  °This information is not intended to replace advice given to you by your health care provider. Make sure you discuss any questions you have with your  health care provider. °  °Document Released: 11/18/2005 Document Revised: 12/09/2014 Document Reviewed: 05/03/2014 °Elsevier Interactive Patient Education ©2016 [employer] ° °

## 2016-07-11 ENCOUNTER — Encounter (HOSPITAL_COMMUNITY): Payer: Self-pay | Admitting: Internal Medicine

## 2016-07-16 ENCOUNTER — Encounter (INDEPENDENT_AMBULATORY_CARE_PROVIDER_SITE_OTHER): Payer: Self-pay | Admitting: *Deleted

## 2016-10-15 ENCOUNTER — Encounter (INDEPENDENT_AMBULATORY_CARE_PROVIDER_SITE_OTHER): Payer: Self-pay | Admitting: Internal Medicine

## 2016-10-15 ENCOUNTER — Ambulatory Visit (INDEPENDENT_AMBULATORY_CARE_PROVIDER_SITE_OTHER): Payer: Managed Care, Other (non HMO) | Admitting: Internal Medicine

## 2016-10-15 ENCOUNTER — Encounter (INDEPENDENT_AMBULATORY_CARE_PROVIDER_SITE_OTHER): Payer: Self-pay

## 2016-10-15 VITALS — BP 166/66 | HR 64 | Temp 98.1°F | Ht 64.0 in | Wt 146.2 lb

## 2016-10-15 DIAGNOSIS — K5909 Other constipation: Secondary | ICD-10-CM | POA: Diagnosis not present

## 2016-10-15 NOTE — Patient Instructions (Signed)
OV as needed 

## 2016-10-15 NOTE — Progress Notes (Signed)
Subjective:    Patient ID: Priscilla FurnaceLottie A Indelicato, female    DOB: 1946/02/11, 70 y.o.   MRN: [id number]  HPI  Three month follow after undergoing colonoscopy in August (screening). She tells me she is doing good.  Her BMs better. She is eating a lot of vegetables. She does take [employer]. She drinks Metamucil daily. She says her constipation is better.  Appetite is good. No weight loss. Family hx negative for CRC.  07/04/2016 Colonoscopy: screening Dr. Karilyn Cotaehman. Impression:               - The examined portion of the ileum was normal.                           - A few erosions in the cecum and at the ileocecal                            valve. Biopsied.                           - The examination was otherwise normal.                           - External hemorrhoids.                           comment: patient had cecal erosions on her last                            exam of Oct, 2003. Doubt IBD>   Results reviewed with patient. She had few erosions at cecum and ileocecal valve. Terminal ileum and rest of the colon was normal. Patient has no symptoms suggest Crohn's disease. She will have sedimentation rate and CRP with her blood work per Dr. Donzetta Sprungerry Daniel.      Review of Systems Past Medical History:  Diagnosis Date  . Hypertension     Past Surgical History:  Procedure Laterality Date  . ABDOMINAL HYSTERECTOMY    . BRAIN SURGERY  1996 [place]  . BUNIONECTOMY Bilateral 1986  . COLONOSCOPY N/A 07/04/2016   Procedure: COLONOSCOPY;  Surgeon: Malissa HippoNajeeb U Rehman, MD;  Location: [facility];  Service: Endoscopy;  Laterality: N/A;  730    Allergies  Allergen Reactions  . Codeine     Jittery   . Erythromycin     Unknown   . Penicillins     unknown  . Sulfa Antibiotics Rash    Rapid heartbeat     Current Outpatient Prescriptions on File Prior to Visit  Medication Sig Dispense Refill  . loratadine (CLARITIN) 10 MG tablet Take 10 mg by mouth daily as needed for  allergies.    . Multiple Vitamins-Minerals (MULTIVITAMINS THER. W/MINERALS) TABS tablet Take 1 tablet by mouth daily.    Ailene Ards. Omega 3-6-9 Fatty Acids (OMEGA-3-6-9 PO) Take 1 capsule by mouth daily.     No current facility-administered medications on file prior to visit.        Objective:   Physical Exam Blood pressure (!) 166/66, pulse 64, temperature 98.1 F (36.7 C), height 5\' 4"  (1.626 m), weight 146 lb 3.2 oz (66.3 kg).  Alert and oriented. Skin warm and dry. Oral mucosa is moist.   . Sclera  anicteric, conjunctivae is pink. Thyroid not enlarged. No cervical lymphadenopathy. Lungs clear. Heart regular rate and rhythm.  Abdomen is soft. Bowel sounds are positive. No hepatomegaly. No abdominal masses felt. No tenderness.  No edema to lower extremities .       Assessment & Plan:  Constipation. She says she is better. Stools are large now. OV in as needed.

## 2019-01-20 DIAGNOSIS — I10 Essential (primary) hypertension: Secondary | ICD-10-CM | POA: Diagnosis not present

## 2019-01-20 DIAGNOSIS — Z13220 Encounter for screening for lipoid disorders: Secondary | ICD-10-CM | POA: Diagnosis not present

## 2019-01-26 DIAGNOSIS — Z0000 Encounter for general adult medical examination without abnormal findings: Secondary | ICD-10-CM | POA: Diagnosis not present

## 2019-01-26 DIAGNOSIS — Z6824 Body mass index (BMI) 24.0-24.9, adult: Secondary | ICD-10-CM | POA: Diagnosis not present

## 2019-02-08 DIAGNOSIS — M810 Age-related osteoporosis without current pathological fracture: Secondary | ICD-10-CM | POA: Diagnosis not present

## 2019-02-08 DIAGNOSIS — M8589 Other specified disorders of bone density and structure, multiple sites: Secondary | ICD-10-CM | POA: Diagnosis not present

## 2019-02-08 DIAGNOSIS — Z8262 Family history of osteoporosis: Secondary | ICD-10-CM | POA: Diagnosis not present

## 2019-07-21 DIAGNOSIS — Z91018 Allergy to other foods: Secondary | ICD-10-CM | POA: Diagnosis not present

## 2019-07-21 DIAGNOSIS — Z6824 Body mass index (BMI) 24.0-24.9, adult: Secondary | ICD-10-CM | POA: Diagnosis not present

## 2019-07-21 DIAGNOSIS — K219 Gastro-esophageal reflux disease without esophagitis: Secondary | ICD-10-CM | POA: Diagnosis not present

## 2019-07-28 DIAGNOSIS — K219 Gastro-esophageal reflux disease without esophagitis: Secondary | ICD-10-CM | POA: Diagnosis not present

## 2019-07-28 DIAGNOSIS — Z0000 Encounter for general adult medical examination without abnormal findings: Secondary | ICD-10-CM | POA: Diagnosis not present

## 2019-07-28 DIAGNOSIS — Z6823 Body mass index (BMI) 23.0-23.9, adult: Secondary | ICD-10-CM | POA: Diagnosis not present

## 2019-07-28 DIAGNOSIS — E785 Hyperlipidemia, unspecified: Secondary | ICD-10-CM | POA: Diagnosis not present

## 2019-07-28 DIAGNOSIS — I10 Essential (primary) hypertension: Secondary | ICD-10-CM | POA: Diagnosis not present

## 2019-08-19 DIAGNOSIS — Z23 Encounter for immunization: Secondary | ICD-10-CM | POA: Diagnosis not present

## 2019-09-17 DIAGNOSIS — Z1231 Encounter for screening mammogram for malignant neoplasm of breast: Secondary | ICD-10-CM | POA: Diagnosis not present

## 2019-11-22 DIAGNOSIS — K219 Gastro-esophageal reflux disease without esophagitis: Secondary | ICD-10-CM | POA: Diagnosis not present

## 2019-11-22 DIAGNOSIS — Z6823 Body mass index (BMI) 23.0-23.9, adult: Secondary | ICD-10-CM | POA: Diagnosis not present

## 2019-11-22 DIAGNOSIS — M810 Age-related osteoporosis without current pathological fracture: Secondary | ICD-10-CM | POA: Diagnosis not present

## 2019-11-22 DIAGNOSIS — E782 Mixed hyperlipidemia: Secondary | ICD-10-CM | POA: Diagnosis not present

## 2019-11-22 DIAGNOSIS — I10 Essential (primary) hypertension: Secondary | ICD-10-CM | POA: Diagnosis not present

## 2019-12-02 DIAGNOSIS — E782 Mixed hyperlipidemia: Secondary | ICD-10-CM | POA: Diagnosis not present

## 2019-12-02 DIAGNOSIS — I10 Essential (primary) hypertension: Secondary | ICD-10-CM | POA: Diagnosis not present

## 2020-01-13 ENCOUNTER — Other Ambulatory Visit: Payer: Self-pay

## 2020-01-13 ENCOUNTER — Ambulatory Visit: Payer: Medicare Other | Attending: Internal Medicine

## 2020-01-13 DIAGNOSIS — Z23 Encounter for immunization: Secondary | ICD-10-CM

## 2020-01-13 NOTE — Progress Notes (Signed)
   Covid-19 Vaccination Clinic  Name:  RUTHA MELGOZA    MRN: [id number] DOB: 1946-10-13  01/13/2020  Ms. Coello was observed post Covid-19 immunization for 15 minutes without incidence. She was provided with Vaccine Information Sheet and instruction to access the V-Safe system.   Ms. Curet was instructed to call 911 with any severe reactions post vaccine: Marland Kitchen Difficulty breathing  . Swelling of your face and throat  . A fast heartbeat  . A bad rash all over your body  . Dizziness and weakness    Immunizations Administered    Name Date Dose VIS Date Route   Moderna COVID-19 Vaccine 01/13/2020 12:35 PM 0.5 mL 11/02/2019 Intramuscular   Manufacturer: Moderna   Lot: [id number]   NDC: 14782-956-21

## 2020-02-14 ENCOUNTER — Ambulatory Visit: Payer: Medicare Other | Attending: Internal Medicine

## 2020-02-14 DIAGNOSIS — Z23 Encounter for immunization: Secondary | ICD-10-CM

## 2020-02-14 NOTE — Progress Notes (Signed)
   Covid-19 Vaccination Clinic  Name:  JULIEANA ESHLEMAN    MRN: [id number] DOB: 1946-02-21  02/14/2020  Ms. Mcbain was observed post Covid-19 immunization for 30 minutes based on pre-vaccination screening without incident. She was provided with Vaccine Information Sheet and instruction to access the V-Safe system.   Ms. Reid was instructed to call 911 with any severe reactions post vaccine: Marland Kitchen Difficulty breathing  . Swelling of face and throat  . A fast heartbeat  . A bad rash all over body  . Dizziness and weakness   Immunizations Administered    Name Date Dose VIS Date Route   Moderna COVID-19 Vaccine 02/14/2020  9:28 AM 0.5 mL 11/02/2019 Intramuscular   Manufacturer: Moderna   Lot: [id number]   NDC: 69409-828-67

## 2020-05-01 DIAGNOSIS — I10 Essential (primary) hypertension: Secondary | ICD-10-CM | POA: Diagnosis not present

## 2020-05-01 DIAGNOSIS — E7849 Other hyperlipidemia: Secondary | ICD-10-CM | POA: Diagnosis not present

## 2020-05-01 DIAGNOSIS — K219 Gastro-esophageal reflux disease without esophagitis: Secondary | ICD-10-CM | POA: Diagnosis not present

## 2020-05-01 DIAGNOSIS — M810 Age-related osteoporosis without current pathological fracture: Secondary | ICD-10-CM | POA: Diagnosis not present

## 2020-05-24 DIAGNOSIS — M810 Age-related osteoporosis without current pathological fracture: Secondary | ICD-10-CM | POA: Diagnosis not present

## 2020-05-24 DIAGNOSIS — K219 Gastro-esophageal reflux disease without esophagitis: Secondary | ICD-10-CM | POA: Diagnosis not present

## 2020-05-24 DIAGNOSIS — E782 Mixed hyperlipidemia: Secondary | ICD-10-CM | POA: Diagnosis not present

## 2020-05-24 DIAGNOSIS — R7303 Prediabetes: Secondary | ICD-10-CM | POA: Diagnosis not present

## 2020-05-24 DIAGNOSIS — Z6824 Body mass index (BMI) 24.0-24.9, adult: Secondary | ICD-10-CM | POA: Diagnosis not present

## 2020-05-24 DIAGNOSIS — I10 Essential (primary) hypertension: Secondary | ICD-10-CM | POA: Diagnosis not present

## 2020-05-31 DIAGNOSIS — I10 Essential (primary) hypertension: Secondary | ICD-10-CM | POA: Diagnosis not present

## 2020-05-31 DIAGNOSIS — K219 Gastro-esophageal reflux disease without esophagitis: Secondary | ICD-10-CM | POA: Diagnosis not present

## 2020-05-31 DIAGNOSIS — M810 Age-related osteoporosis without current pathological fracture: Secondary | ICD-10-CM | POA: Diagnosis not present

## 2020-05-31 DIAGNOSIS — E7849 Other hyperlipidemia: Secondary | ICD-10-CM | POA: Diagnosis not present

## 2020-06-30 DIAGNOSIS — I10 Essential (primary) hypertension: Secondary | ICD-10-CM | POA: Diagnosis not present

## 2020-06-30 DIAGNOSIS — E7849 Other hyperlipidemia: Secondary | ICD-10-CM | POA: Diagnosis not present

## 2020-06-30 DIAGNOSIS — K219 Gastro-esophageal reflux disease without esophagitis: Secondary | ICD-10-CM | POA: Diagnosis not present

## 2020-06-30 DIAGNOSIS — M810 Age-related osteoporosis without current pathological fracture: Secondary | ICD-10-CM | POA: Diagnosis not present

## 2020-08-31 DIAGNOSIS — E7849 Other hyperlipidemia: Secondary | ICD-10-CM | POA: Diagnosis not present

## 2020-08-31 DIAGNOSIS — K219 Gastro-esophageal reflux disease without esophagitis: Secondary | ICD-10-CM | POA: Diagnosis not present

## 2020-08-31 DIAGNOSIS — I10 Essential (primary) hypertension: Secondary | ICD-10-CM | POA: Diagnosis not present

## 2020-08-31 DIAGNOSIS — M810 Age-related osteoporosis without current pathological fracture: Secondary | ICD-10-CM | POA: Diagnosis not present

## 2020-09-06 DIAGNOSIS — M25561 Pain in right knee: Secondary | ICD-10-CM | POA: Diagnosis not present

## 2020-09-06 DIAGNOSIS — Z6824 Body mass index (BMI) 24.0-24.9, adult: Secondary | ICD-10-CM | POA: Diagnosis not present

## 2020-09-06 DIAGNOSIS — Z23 Encounter for immunization: Secondary | ICD-10-CM | POA: Diagnosis not present

## 2020-11-23 DIAGNOSIS — M810 Age-related osteoporosis without current pathological fracture: Secondary | ICD-10-CM | POA: Diagnosis not present

## 2020-11-23 DIAGNOSIS — Z1389 Encounter for screening for other disorder: Secondary | ICD-10-CM | POA: Diagnosis not present

## 2020-11-23 DIAGNOSIS — K219 Gastro-esophageal reflux disease without esophagitis: Secondary | ICD-10-CM | POA: Diagnosis not present

## 2020-11-23 DIAGNOSIS — I10 Essential (primary) hypertension: Secondary | ICD-10-CM | POA: Diagnosis not present

## 2020-11-23 DIAGNOSIS — Z6824 Body mass index (BMI) 24.0-24.9, adult: Secondary | ICD-10-CM | POA: Diagnosis not present

## 2020-11-23 DIAGNOSIS — E782 Mixed hyperlipidemia: Secondary | ICD-10-CM | POA: Diagnosis not present

## 2020-12-01 DIAGNOSIS — I10 Essential (primary) hypertension: Secondary | ICD-10-CM | POA: Diagnosis not present

## 2020-12-01 DIAGNOSIS — E7849 Other hyperlipidemia: Secondary | ICD-10-CM | POA: Diagnosis not present

## 2020-12-01 DIAGNOSIS — M810 Age-related osteoporosis without current pathological fracture: Secondary | ICD-10-CM | POA: Diagnosis not present

## 2020-12-08 DIAGNOSIS — H5213 Myopia, bilateral: Secondary | ICD-10-CM | POA: Diagnosis not present

## 2020-12-30 DIAGNOSIS — I10 Essential (primary) hypertension: Secondary | ICD-10-CM | POA: Diagnosis not present

## 2020-12-30 DIAGNOSIS — M810 Age-related osteoporosis without current pathological fracture: Secondary | ICD-10-CM | POA: Diagnosis not present

## 2020-12-30 DIAGNOSIS — E7849 Other hyperlipidemia: Secondary | ICD-10-CM | POA: Diagnosis not present

## 2020-12-30 DIAGNOSIS — K219 Gastro-esophageal reflux disease without esophagitis: Secondary | ICD-10-CM | POA: Diagnosis not present

## 2021-01-29 DIAGNOSIS — I10 Essential (primary) hypertension: Secondary | ICD-10-CM | POA: Diagnosis not present

## 2021-01-29 DIAGNOSIS — E7849 Other hyperlipidemia: Secondary | ICD-10-CM | POA: Diagnosis not present

## 2021-01-29 DIAGNOSIS — K219 Gastro-esophageal reflux disease without esophagitis: Secondary | ICD-10-CM | POA: Diagnosis not present

## 2021-01-29 DIAGNOSIS — M810 Age-related osteoporosis without current pathological fracture: Secondary | ICD-10-CM | POA: Diagnosis not present

## 2021-02-08 DIAGNOSIS — M810 Age-related osteoporosis without current pathological fracture: Secondary | ICD-10-CM | POA: Diagnosis not present

## 2021-02-28 DIAGNOSIS — I10 Essential (primary) hypertension: Secondary | ICD-10-CM | POA: Diagnosis not present

## 2021-02-28 DIAGNOSIS — E7849 Other hyperlipidemia: Secondary | ICD-10-CM | POA: Diagnosis not present

## 2021-02-28 DIAGNOSIS — K219 Gastro-esophageal reflux disease without esophagitis: Secondary | ICD-10-CM | POA: Diagnosis not present

## 2021-02-28 DIAGNOSIS — M810 Age-related osteoporosis without current pathological fracture: Secondary | ICD-10-CM | POA: Diagnosis not present

## 2021-03-28 DIAGNOSIS — N6322 Unspecified lump in the left breast, upper inner quadrant: Secondary | ICD-10-CM | POA: Diagnosis not present

## 2021-03-28 DIAGNOSIS — R922 Inconclusive mammogram: Secondary | ICD-10-CM | POA: Diagnosis not present

## 2021-05-29 DIAGNOSIS — Z131 Encounter for screening for diabetes mellitus: Secondary | ICD-10-CM | POA: Diagnosis not present

## 2021-05-29 DIAGNOSIS — Z6824 Body mass index (BMI) 24.0-24.9, adult: Secondary | ICD-10-CM | POA: Diagnosis not present

## 2021-05-29 DIAGNOSIS — E782 Mixed hyperlipidemia: Secondary | ICD-10-CM | POA: Diagnosis not present

## 2021-05-29 DIAGNOSIS — M810 Age-related osteoporosis without current pathological fracture: Secondary | ICD-10-CM | POA: Diagnosis not present

## 2021-05-29 DIAGNOSIS — Z0001 Encounter for general adult medical examination with abnormal findings: Secondary | ICD-10-CM | POA: Diagnosis not present

## 2021-05-29 DIAGNOSIS — K219 Gastro-esophageal reflux disease without esophagitis: Secondary | ICD-10-CM | POA: Diagnosis not present

## 2021-05-29 DIAGNOSIS — I10 Essential (primary) hypertension: Secondary | ICD-10-CM | POA: Diagnosis not present

## 2021-05-29 DIAGNOSIS — E7849 Other hyperlipidemia: Secondary | ICD-10-CM | POA: Diagnosis not present

## 2021-05-31 DIAGNOSIS — I10 Essential (primary) hypertension: Secondary | ICD-10-CM | POA: Diagnosis not present

## 2021-05-31 DIAGNOSIS — K219 Gastro-esophageal reflux disease without esophagitis: Secondary | ICD-10-CM | POA: Diagnosis not present

## 2021-05-31 DIAGNOSIS — E7849 Other hyperlipidemia: Secondary | ICD-10-CM | POA: Diagnosis not present

## 2021-05-31 DIAGNOSIS — M810 Age-related osteoporosis without current pathological fracture: Secondary | ICD-10-CM | POA: Diagnosis not present

## 2021-06-06 DIAGNOSIS — M810 Age-related osteoporosis without current pathological fracture: Secondary | ICD-10-CM | POA: Diagnosis not present

## 2021-09-20 DIAGNOSIS — Z23 Encounter for immunization: Secondary | ICD-10-CM | POA: Diagnosis not present

## 2021-11-26 DIAGNOSIS — J069 Acute upper respiratory infection, unspecified: Secondary | ICD-10-CM | POA: Diagnosis not present

## 2021-12-10 DIAGNOSIS — I10 Essential (primary) hypertension: Secondary | ICD-10-CM | POA: Diagnosis not present

## 2021-12-10 DIAGNOSIS — K219 Gastro-esophageal reflux disease without esophagitis: Secondary | ICD-10-CM | POA: Diagnosis not present

## 2021-12-10 DIAGNOSIS — M810 Age-related osteoporosis without current pathological fracture: Secondary | ICD-10-CM | POA: Diagnosis not present

## 2021-12-10 DIAGNOSIS — E7849 Other hyperlipidemia: Secondary | ICD-10-CM | POA: Diagnosis not present

## 2021-12-14 DIAGNOSIS — M810 Age-related osteoporosis without current pathological fracture: Secondary | ICD-10-CM | POA: Diagnosis not present

## 2021-12-30 DIAGNOSIS — E7849 Other hyperlipidemia: Secondary | ICD-10-CM | POA: Diagnosis not present

## 2021-12-30 DIAGNOSIS — I10 Essential (primary) hypertension: Secondary | ICD-10-CM | POA: Diagnosis not present

## 2022-05-07 DIAGNOSIS — Z1231 Encounter for screening mammogram for malignant neoplasm of breast: Secondary | ICD-10-CM | POA: Diagnosis not present

## 2022-06-13 DIAGNOSIS — R739 Hyperglycemia, unspecified: Secondary | ICD-10-CM | POA: Diagnosis not present

## 2022-06-13 DIAGNOSIS — I10 Essential (primary) hypertension: Secondary | ICD-10-CM | POA: Diagnosis not present

## 2022-06-13 DIAGNOSIS — M810 Age-related osteoporosis without current pathological fracture: Secondary | ICD-10-CM | POA: Diagnosis not present

## 2022-06-13 DIAGNOSIS — K219 Gastro-esophageal reflux disease without esophagitis: Secondary | ICD-10-CM | POA: Diagnosis not present

## 2022-06-13 DIAGNOSIS — E7849 Other hyperlipidemia: Secondary | ICD-10-CM | POA: Diagnosis not present

## 2022-06-13 DIAGNOSIS — Z1389 Encounter for screening for other disorder: Secondary | ICD-10-CM | POA: Diagnosis not present

## 2022-06-13 DIAGNOSIS — Z6823 Body mass index (BMI) 23.0-23.9, adult: Secondary | ICD-10-CM | POA: Diagnosis not present

## 2022-09-06 DIAGNOSIS — Z6823 Body mass index (BMI) 23.0-23.9, adult: Secondary | ICD-10-CM | POA: Diagnosis not present

## 2022-09-06 DIAGNOSIS — M25519 Pain in unspecified shoulder: Secondary | ICD-10-CM | POA: Diagnosis not present

## 2022-09-06 DIAGNOSIS — Z23 Encounter for immunization: Secondary | ICD-10-CM | POA: Diagnosis not present

## 2022-09-23 DIAGNOSIS — M25512 Pain in left shoulder: Secondary | ICD-10-CM | POA: Diagnosis not present

## 2022-09-23 DIAGNOSIS — Z6823 Body mass index (BMI) 23.0-23.9, adult: Secondary | ICD-10-CM | POA: Diagnosis not present

## 2022-09-24 DIAGNOSIS — M25512 Pain in left shoulder: Secondary | ICD-10-CM | POA: Diagnosis not present

## 2022-10-23 DIAGNOSIS — M25512 Pain in left shoulder: Secondary | ICD-10-CM | POA: Diagnosis not present

## 2022-11-12 DIAGNOSIS — Z23 Encounter for immunization: Secondary | ICD-10-CM | POA: Diagnosis not present

## 2022-11-21 DIAGNOSIS — M25512 Pain in left shoulder: Secondary | ICD-10-CM | POA: Diagnosis not present

## 2022-11-28 DIAGNOSIS — R739 Hyperglycemia, unspecified: Secondary | ICD-10-CM | POA: Diagnosis not present

## 2022-11-28 DIAGNOSIS — E7849 Other hyperlipidemia: Secondary | ICD-10-CM | POA: Diagnosis not present

## 2022-11-28 DIAGNOSIS — K219 Gastro-esophageal reflux disease without esophagitis: Secondary | ICD-10-CM | POA: Diagnosis not present

## 2022-11-28 DIAGNOSIS — I10 Essential (primary) hypertension: Secondary | ICD-10-CM | POA: Diagnosis not present

## 2022-12-03 DIAGNOSIS — R739 Hyperglycemia, unspecified: Secondary | ICD-10-CM | POA: Diagnosis not present

## 2022-12-03 DIAGNOSIS — M810 Age-related osteoporosis without current pathological fracture: Secondary | ICD-10-CM | POA: Diagnosis not present

## 2022-12-03 DIAGNOSIS — K219 Gastro-esophageal reflux disease without esophagitis: Secondary | ICD-10-CM | POA: Diagnosis not present

## 2022-12-03 DIAGNOSIS — Z6824 Body mass index (BMI) 24.0-24.9, adult: Secondary | ICD-10-CM | POA: Diagnosis not present

## 2022-12-03 DIAGNOSIS — I10 Essential (primary) hypertension: Secondary | ICD-10-CM | POA: Diagnosis not present

## 2022-12-03 DIAGNOSIS — E7849 Other hyperlipidemia: Secondary | ICD-10-CM | POA: Diagnosis not present

## 2022-12-11 DIAGNOSIS — M25512 Pain in left shoulder: Secondary | ICD-10-CM | POA: Diagnosis not present

## 2023-02-18 DIAGNOSIS — H35033 Hypertensive retinopathy, bilateral: Secondary | ICD-10-CM | POA: Diagnosis not present

## 2023-02-18 DIAGNOSIS — H524 Presbyopia: Secondary | ICD-10-CM | POA: Diagnosis not present

## 2023-05-22 DIAGNOSIS — Z1231 Encounter for screening mammogram for malignant neoplasm of breast: Secondary | ICD-10-CM | POA: Diagnosis not present

## 2023-05-22 DIAGNOSIS — M810 Age-related osteoporosis without current pathological fracture: Secondary | ICD-10-CM | POA: Diagnosis not present

## 2023-05-27 DIAGNOSIS — E7849 Other hyperlipidemia: Secondary | ICD-10-CM | POA: Diagnosis not present

## 2023-05-27 DIAGNOSIS — R739 Hyperglycemia, unspecified: Secondary | ICD-10-CM | POA: Diagnosis not present

## 2023-05-27 DIAGNOSIS — I10 Essential (primary) hypertension: Secondary | ICD-10-CM | POA: Diagnosis not present

## 2023-06-03 DIAGNOSIS — K219 Gastro-esophageal reflux disease without esophagitis: Secondary | ICD-10-CM | POA: Diagnosis not present

## 2023-06-03 DIAGNOSIS — M810 Age-related osteoporosis without current pathological fracture: Secondary | ICD-10-CM | POA: Diagnosis not present

## 2023-06-03 DIAGNOSIS — E7849 Other hyperlipidemia: Secondary | ICD-10-CM | POA: Diagnosis not present

## 2023-06-03 DIAGNOSIS — Z6824 Body mass index (BMI) 24.0-24.9, adult: Secondary | ICD-10-CM | POA: Diagnosis not present

## 2023-06-03 DIAGNOSIS — R739 Hyperglycemia, unspecified: Secondary | ICD-10-CM | POA: Diagnosis not present

## 2023-06-03 DIAGNOSIS — I10 Essential (primary) hypertension: Secondary | ICD-10-CM | POA: Diagnosis not present

## 2023-06-18 DIAGNOSIS — I739 Peripheral vascular disease, unspecified: Secondary | ICD-10-CM | POA: Diagnosis not present

## 2023-07-11 DIAGNOSIS — U071 COVID-19: Secondary | ICD-10-CM | POA: Diagnosis not present

## 2023-08-19 DIAGNOSIS — J3089 Other allergic rhinitis: Secondary | ICD-10-CM | POA: Diagnosis not present

## 2023-08-19 DIAGNOSIS — H1045 Other chronic allergic conjunctivitis: Secondary | ICD-10-CM | POA: Diagnosis not present

## 2023-08-19 DIAGNOSIS — T783XXA Angioneurotic edema, initial encounter: Secondary | ICD-10-CM | POA: Diagnosis not present

## 2023-08-23 DIAGNOSIS — Z23 Encounter for immunization: Secondary | ICD-10-CM | POA: Diagnosis not present

## 2023-08-23 DIAGNOSIS — N644 Mastodynia: Secondary | ICD-10-CM | POA: Diagnosis not present

## 2023-08-23 DIAGNOSIS — Z6824 Body mass index (BMI) 24.0-24.9, adult: Secondary | ICD-10-CM | POA: Diagnosis not present

## 2023-09-24 DIAGNOSIS — R92333 Mammographic heterogeneous density, bilateral breasts: Secondary | ICD-10-CM | POA: Diagnosis not present

## 2023-09-24 DIAGNOSIS — N644 Mastodynia: Secondary | ICD-10-CM | POA: Diagnosis not present

## 2023-12-11 DIAGNOSIS — K219 Gastro-esophageal reflux disease without esophagitis: Secondary | ICD-10-CM | POA: Diagnosis not present

## 2023-12-11 DIAGNOSIS — R739 Hyperglycemia, unspecified: Secondary | ICD-10-CM | POA: Diagnosis not present

## 2023-12-11 DIAGNOSIS — E7849 Other hyperlipidemia: Secondary | ICD-10-CM | POA: Diagnosis not present

## 2023-12-16 DIAGNOSIS — Z6824 Body mass index (BMI) 24.0-24.9, adult: Secondary | ICD-10-CM | POA: Diagnosis not present

## 2023-12-16 DIAGNOSIS — E782 Mixed hyperlipidemia: Secondary | ICD-10-CM | POA: Diagnosis not present

## 2023-12-16 DIAGNOSIS — I10 Essential (primary) hypertension: Secondary | ICD-10-CM | POA: Diagnosis not present

## 2023-12-16 DIAGNOSIS — M810 Age-related osteoporosis without current pathological fracture: Secondary | ICD-10-CM | POA: Diagnosis not present

## 2024-02-19 DIAGNOSIS — H524 Presbyopia: Secondary | ICD-10-CM | POA: Diagnosis not present

## 2024-02-19 DIAGNOSIS — H35363 Drusen (degenerative) of macula, bilateral: Secondary | ICD-10-CM | POA: Diagnosis not present

## 2024-03-18 DIAGNOSIS — Z6824 Body mass index (BMI) 24.0-24.9, adult: Secondary | ICD-10-CM | POA: Diagnosis not present

## 2024-03-18 DIAGNOSIS — J019 Acute sinusitis, unspecified: Secondary | ICD-10-CM | POA: Diagnosis not present

## 2024-03-18 DIAGNOSIS — L649 Androgenic alopecia, unspecified: Secondary | ICD-10-CM | POA: Diagnosis not present

## 2024-04-14 DIAGNOSIS — R109 Unspecified abdominal pain: Secondary | ICD-10-CM | POA: Diagnosis not present

## 2024-04-14 DIAGNOSIS — I10 Essential (primary) hypertension: Secondary | ICD-10-CM | POA: Diagnosis not present

## 2024-04-14 DIAGNOSIS — J019 Acute sinusitis, unspecified: Secondary | ICD-10-CM | POA: Diagnosis not present

## 2024-04-14 DIAGNOSIS — Z6823 Body mass index (BMI) 23.0-23.9, adult: Secondary | ICD-10-CM | POA: Diagnosis not present

## 2024-04-21 DIAGNOSIS — L648 Other androgenic alopecia: Secondary | ICD-10-CM | POA: Diagnosis not present

## 2024-04-21 DIAGNOSIS — L638 Other alopecia areata: Secondary | ICD-10-CM | POA: Diagnosis not present

## 2024-05-27 DIAGNOSIS — Z1231 Encounter for screening mammogram for malignant neoplasm of breast: Secondary | ICD-10-CM | POA: Diagnosis not present

## 2024-06-14 DIAGNOSIS — E7849 Other hyperlipidemia: Secondary | ICD-10-CM | POA: Diagnosis not present

## 2024-06-14 DIAGNOSIS — I10 Essential (primary) hypertension: Secondary | ICD-10-CM | POA: Diagnosis not present

## 2024-06-14 DIAGNOSIS — M810 Age-related osteoporosis without current pathological fracture: Secondary | ICD-10-CM | POA: Diagnosis not present

## 2024-06-14 DIAGNOSIS — Z6823 Body mass index (BMI) 23.0-23.9, adult: Secondary | ICD-10-CM | POA: Diagnosis not present

## 2024-06-14 DIAGNOSIS — E782 Mixed hyperlipidemia: Secondary | ICD-10-CM | POA: Diagnosis not present

## 2024-07-01 DIAGNOSIS — L680 Hirsutism: Secondary | ICD-10-CM | POA: Diagnosis not present

## 2024-07-29 DIAGNOSIS — L818 Other specified disorders of pigmentation: Secondary | ICD-10-CM | POA: Diagnosis not present

## 2024-07-29 DIAGNOSIS — L680 Hirsutism: Secondary | ICD-10-CM | POA: Diagnosis not present

## 2024-09-27 DIAGNOSIS — Z23 Encounter for immunization: Secondary | ICD-10-CM | POA: Diagnosis not present
